# Patient Record
Sex: Male | Born: 1985 | Hispanic: Yes | Marital: Married | Smoking: Never smoker
Health system: Southern US, Community
[De-identification: ages and names within clinical notes are randomized; demographics above are authoritative.]

## PROBLEM LIST (undated history)

## (undated) DIAGNOSIS — K219 Gastro-esophageal reflux disease without esophagitis: Secondary | ICD-10-CM

## (undated) DIAGNOSIS — F988 Other specified behavioral and emotional disorders with onset usually occurring in childhood and adolescence: Secondary | ICD-10-CM

## (undated) HISTORY — PX: HERNIA REPAIR: SHX51

## (undated) HISTORY — PX: ANKLE SURGERY: SHX546

## (undated) HISTORY — PX: ARTHROSCOPIC REPAIR ACL: SUR80

---

## 2007-09-27 ENCOUNTER — Emergency Department (HOSPITAL_COMMUNITY): Admission: EM | Admit: 2007-09-27 | Discharge: 2007-09-27 | Payer: Self-pay | Admitting: Emergency Medicine

## 2013-02-28 ENCOUNTER — Other Ambulatory Visit: Payer: Self-pay | Admitting: Occupational Medicine

## 2013-02-28 ENCOUNTER — Ambulatory Visit
Admission: RE | Admit: 2013-02-28 | Discharge: 2013-02-28 | Disposition: A | Discharge status: Home / Self Care | Payer: No Typology Code available for payment source | Source: Ambulatory Visit | Attending: Occupational Medicine | Admitting: Occupational Medicine

## 2013-02-28 DIAGNOSIS — Z021 Encounter for pre-employment examination: Secondary | ICD-10-CM

## 2014-07-26 ENCOUNTER — Other Ambulatory Visit: Payer: Self-pay | Admitting: Occupational Medicine

## 2014-07-26 ENCOUNTER — Ambulatory Visit: Payer: Self-pay

## 2014-07-26 DIAGNOSIS — R52 Pain, unspecified: Secondary | ICD-10-CM

## 2016-09-24 ENCOUNTER — Emergency Department (HOSPITAL_BASED_OUTPATIENT_CLINIC_OR_DEPARTMENT_OTHER)
Admission: EM | Admit: 2016-09-24 | Discharge: 2016-09-24 | Disposition: A | Discharge status: Home / Self Care | Payer: Commercial Managed Care - HMO | Attending: Emergency Medicine | Admitting: Emergency Medicine

## 2016-09-24 ENCOUNTER — Encounter (HOSPITAL_BASED_OUTPATIENT_CLINIC_OR_DEPARTMENT_OTHER): Payer: Self-pay | Admitting: *Deleted

## 2016-09-24 ENCOUNTER — Emergency Department (HOSPITAL_BASED_OUTPATIENT_CLINIC_OR_DEPARTMENT_OTHER): Payer: Commercial Managed Care - HMO

## 2016-09-24 DIAGNOSIS — F909 Attention-deficit hyperactivity disorder, unspecified type: Secondary | ICD-10-CM | POA: Insufficient documentation

## 2016-09-24 DIAGNOSIS — R1033 Periumbilical pain: Secondary | ICD-10-CM

## 2016-09-24 DIAGNOSIS — R110 Nausea: Secondary | ICD-10-CM | POA: Insufficient documentation

## 2016-09-24 DIAGNOSIS — Z79899 Other long term (current) drug therapy: Secondary | ICD-10-CM | POA: Diagnosis not present

## 2016-09-24 HISTORY — DX: Gastro-esophageal reflux disease without esophagitis: K21.9

## 2016-09-24 HISTORY — DX: Other specified behavioral and emotional disorders with onset usually occurring in childhood and adolescence: F98.8

## 2016-09-24 LAB — CBC WITH DIFFERENTIAL/PLATELET
BASOS ABS: 0 10*3/uL (ref 0.0–0.1)
Basophils Relative: 0 %
EOS PCT: 1 %
Eosinophils Absolute: 0.1 10*3/uL (ref 0.0–0.7)
HEMATOCRIT: 43.9 % (ref 39.0–52.0)
HEMOGLOBIN: 15.3 g/dL (ref 13.0–17.0)
LYMPHS ABS: 2 10*3/uL (ref 0.7–4.0)
LYMPHS PCT: 18 %
MCH: 30.2 pg (ref 26.0–34.0)
MCHC: 34.9 g/dL (ref 30.0–36.0)
MCV: 86.6 fL (ref 78.0–100.0)
Monocytes Absolute: 0.5 10*3/uL (ref 0.1–1.0)
Monocytes Relative: 5 %
NEUTROS ABS: 8.4 10*3/uL — AB (ref 1.7–7.7)
NEUTROS PCT: 76 %
PLATELETS: 372 10*3/uL (ref 150–400)
RBC: 5.07 MIL/uL (ref 4.22–5.81)
RDW: 12.4 % (ref 11.5–15.5)
WBC: 11 10*3/uL — AB (ref 4.0–10.5)

## 2016-09-24 LAB — COMPREHENSIVE METABOLIC PANEL
ALK PHOS: 64 U/L (ref 38–126)
ALT: 53 U/L (ref 17–63)
AST: 27 U/L (ref 15–41)
Albumin: 4.9 g/dL (ref 3.5–5.0)
Anion gap: 8 (ref 5–15)
BUN: 17 mg/dL (ref 6–20)
CALCIUM: 9.6 mg/dL (ref 8.9–10.3)
CHLORIDE: 101 mmol/L (ref 101–111)
CO2: 28 mmol/L (ref 22–32)
CREATININE: 0.64 mg/dL (ref 0.61–1.24)
GFR calc Af Amer: >60 mL/min (ref >60)
Glucose, Bld: 118 mg/dL — ABNORMAL HIGH (ref 65–99)
Potassium: 4.2 mmol/L (ref 3.5–5.1)
Sodium: 137 mmol/L (ref 135–145)
Total Bilirubin: 0.9 mg/dL (ref 0.3–1.2)
Total Protein: 8.5 g/dL — ABNORMAL HIGH (ref 6.5–8.1)

## 2016-09-24 LAB — LIPASE, BLOOD: LIPASE: 19 U/L (ref 11–51)

## 2016-09-24 MED ORDER — METOCLOPRAMIDE HCL 5 MG/ML IJ SOLN
10.0000 mg | Freq: Once | INTRAMUSCULAR | Status: AC
  Administered 2016-09-24: 10 mg via INTRAVENOUS
  Filled 2016-09-24: qty 2

## 2016-09-24 MED ORDER — ONDANSETRON 4 MG PO TBDP
4.0000 mg | ORAL_TABLET | Freq: Once | ORAL | Status: AC
  Administered 2016-09-24: 4 mg via ORAL
  Filled 2016-09-24: qty 1

## 2016-09-24 MED ORDER — MORPHINE SULFATE (PF) 4 MG/ML IV SOLN
4.0000 mg | Freq: Once | INTRAVENOUS | Status: AC
  Administered 2016-09-24: 4 mg via INTRAVENOUS
  Filled 2016-09-24: qty 1

## 2016-09-24 MED ORDER — IOPAMIDOL (ISOVUE-300) INJECTION 61%
100.0000 mL | Freq: Once | INTRAVENOUS | Status: AC | PRN
  Administered 2016-09-24: 100 mL via INTRAVENOUS

## 2016-09-24 MED ORDER — ONDANSETRON HCL 4 MG PO TABS: 4.0000 mg | ORAL_TABLET | Freq: Three times a day (TID) | ORAL | 0 refills | Status: AC | PRN

## 2016-09-24 NOTE — ED Provider Notes (Signed)
[facility] Provider [facility]   CSN: [id number] Arrival date & time: 09/24/16  1447     History   Chief Complaint Chief Complaint  Patient presents with  . Abdominal Pain    HPI Mayer CamelBrian G Michalik is a 30 y.o. male.  The history is provided by the patient.  Abdominal Pain   This is a new problem. Episode onset: 1 week. Episode frequency: it was constant for 1 day; resolved with antiemetic and returned this am. The problem has not changed since onset.The pain is associated with an unknown factor. The pain is located in the periumbilical region. The pain is at a severity of 5/10. Associated symptoms include nausea. Pertinent negatives include anorexia, fever, diarrhea, hematochezia, melena, vomiting, constipation, dysuria, frequency, hematuria, headaches and arthralgias. The symptoms are aggravated by palpation and certain positions (standing up straight). The symptoms are relieved by certain positions (bending at the hips).    Past Medical History:  Diagnosis Date  . ADD (attention deficit disorder)   . GERD (gastroesophageal reflux disease)     There are no active problems to display for this patient.   Past Surgical History:  Procedure Laterality Date  . ARTHROSCOPIC REPAIR ACL    . HERNIA REPAIR         Home Medications    Prior to Admission medications   Medication Sig Start Date End Date Taking? Authorizing Provider  methylphenidate 36 MG PO CR tablet Take 36 mg by mouth daily.   Yes Historical Provider, MD  pantoprazole (PROTONIX) 40 MG tablet Take 40 mg by mouth daily.   Yes Historical Provider, MD  ondansetron (ZOFRAN) 4 MG tablet Take 1 tablet (4 mg total) by mouth every 8 (eight) hours as needed for nausea or vomiting. 09/24/16 09/27/16  Nira ConnPedro Eduardo Cardama, MD    Family History History reviewed. No pertinent family history.  Social History Social History  Substance Use Topics  . Smoking status: Never Smoker  . Smokeless tobacco: Never Used  .  Alcohol use No     Allergies   Patient has no known allergies.   Review of Systems Review of Systems  Constitutional: Negative for fever.  Gastrointestinal: Positive for abdominal pain and nausea. Negative for anorexia, constipation, diarrhea, hematochezia, melena and vomiting.  Genitourinary: Negative for dysuria, frequency and hematuria.  Musculoskeletal: Negative for arthralgias.  Neurological: Negative for headaches.  Ten systems are reviewed and are negative for acute change except as noted in the HPI    Physical Exam Updated Vital Signs BP 140/90   Pulse 96   Temp 97.6 F (36.4 C) (Oral)   Resp 18   Ht 5\' 8"  (1.727 m)   Wt 230 lb (104.3 kg)   SpO2 98%   BMI 34.97 kg/m   Physical Exam  Constitutional: He is oriented to person, place, and time. He appears well-developed and well-nourished. No distress.  HENT:  Head: Normocephalic and atraumatic.  Nose: Nose normal.  Eyes: Conjunctivae and EOM are normal. Pupils are equal, round, and reactive to light. Right eye exhibits no discharge. Left eye exhibits no discharge. No scleral icterus.  Neck: Normal range of motion. Neck supple.  Cardiovascular: Normal rate and regular rhythm.  Exam reveals no gallop and no friction rub.   No murmur heard. Pulmonary/Chest: Effort normal and breath sounds normal. No stridor. No respiratory distress. He has no rales.  Abdominal: Soft. He exhibits no distension. There is tenderness in the periumbilical area. There is no rigidity, no rebound, no guarding  and no CVA tenderness. No hernia. Hernia confirmed negative in the ventral area, confirmed negative in the right inguinal area and confirmed negative in the left inguinal area.    Musculoskeletal: He exhibits no edema or tenderness.  Neurological: He is alert and oriented to person, place, and time.  Skin: Skin is warm and dry. No rash noted. He is not diaphoretic. No erythema.  Psychiatric: He has a normal mood and affect.  Vitals  reviewed.    ED Treatments / Results  Labs (all labs ordered are listed, but only abnormal results are displayed) Labs Reviewed  CBC WITH DIFFERENTIAL/PLATELET - Abnormal; Notable for the following:       Result Value   WBC 11.0 (*)    Neutro Abs 8.4 (*)    All other components within normal limits  COMPREHENSIVE METABOLIC PANEL - Abnormal; Notable for the following:    Glucose, Bld 118 (*)    Total Protein 8.5 (*)    All other components within normal limits  LIPASE, BLOOD    EKG  EKG Interpretation None       Radiology Ct Abdomen Pelvis W Contrast  Result Date: 09/24/2016 CLINICAL DATA:  Patient with right lower quadrant pain for 1 week. Prior right inguinal hernia repair. EXAM: CT ABDOMEN AND PELVIS WITH CONTRAST TECHNIQUE: Multidetector CT imaging of the abdomen and pelvis was performed using the standard protocol following bolus administration of intravenous contrast. CONTRAST:  100mL ISOVUE-300 IOPAMIDOL (ISOVUE-300) INJECTION 61% COMPARISON:  None. FINDINGS: Lower chest: Normal heart size. Bilateral gynecomastia. Dependent atelectasis within the bilateral lower lobes. No pleural effusion. Hepatobiliary: Liver is normal in size and contour. No focal lesion is identified. Gallbladder is unremarkable. Pancreas: Unremarkable Spleen: Unremarkable Adrenals/Urinary Tract: The adrenal glands are normal. Kidneys enhance symmetrically with contrast. No hydronephrosis. No nephroureterolithiasis. Urinary bladder is unremarkable. Stomach/Bowel: No abnormal bowel wall thickening or evidence for bowel obstruction. No free fluid or free intraperitoneal air. The appendix is normal. Normal morphology of the stomach. No free fluid or free intraperitoneal air. Vascular/Lymphatic: Normal caliber abdominal aorta. No retroperitoneal lymphadenopathy. Reproductive: Prostate unremarkable. Other: None. Musculoskeletal: No aggressive or acute appearing osseous lesions. IMPRESSION: No acute process within  abdomen or pelvis. Electronically Signed   By: Annia Beltrew  Davis M.D.   On: 09/24/2016 17:36    Procedures Procedures (including critical care time)  Medications Ordered in ED Medications  ondansetron (ZOFRAN-ODT) disintegrating tablet 4 mg (4 mg Oral Given 09/24/16 1531)  metoCLOPramide (REGLAN) injection 10 mg (10 mg Intravenous Given 09/24/16 1626)  morphine 4 MG/ML injection 4 mg (4 mg Intravenous Given 09/24/16 1626)  iopamidol (ISOVUE-300) 61 % injection 100 mL (100 mLs Intravenous Contrast Given 09/24/16 1713)     Initial Impression / Assessment and Plan / ED Course  I have reviewed the triage vital signs and the nursing notes.  Pertinent labs & imaging results that were available during my care of the patient were reviewed by me and considered in my medical decision making (see chart for details).  Clinical Course     Labs reveal leukocytosis. Therefore given the patient's periumbilical and right lower quadrant pain we'll obtain a CT to rule out appendicitis.  CT negative for any intra-abdominal inflammatory infectious process. Patient provided with IV fluids and symptomatic medicine. Patient able to tolerate by mouth. Safe for discharge with strict return precautions.  Final Clinical Impressions(s) / ED Diagnoses   Final diagnoses:  Periumbilical abdominal pain  Nausea   Disposition: Discharge  Condition: Good  I have  discussed the results, Dx and Tx plan with the patient who expressed understanding and agree(s) with the plan. Discharge instructions discussed at great length. The patient was given strict return precautions who verbalized understanding of the instructions. No further questions at time of discharge.    New Prescriptions   ONDANSETRON (ZOFRAN) 4 MG TABLET    Take 1 tablet (4 mg total) by mouth every 8 (eight) hours as needed for nausea or vomiting.    Follow Up: Jolene Provost, MD [address] [place] [address] [phone]  Schedule an  appointment as soon as possible for a visit  in 3-5 days, If symptoms do not improve or  worsen      Nira Conn, MD 09/24/16 1819

## 2016-09-24 NOTE — ED Triage Notes (Signed)
Pt c/o right lower abd pain x 1 week.

## 2016-09-24 NOTE — ED Notes (Signed)
Pt has increased nausea after PO challenge with H2O. EDP notified.

## 2017-05-14 ENCOUNTER — Ambulatory Visit
Admission: RE | Admit: 2017-05-14 | Discharge: 2017-05-14 | Disposition: A | Discharge status: Home / Self Care | Payer: Worker's Compensation | Source: Ambulatory Visit | Attending: Nurse Practitioner | Admitting: Nurse Practitioner

## 2017-05-14 ENCOUNTER — Other Ambulatory Visit: Payer: Self-pay | Admitting: Nurse Practitioner

## 2017-05-14 DIAGNOSIS — S6992XS Unspecified injury of left wrist, hand and finger(s), sequela: Secondary | ICD-10-CM
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
## 2018-10-07 ENCOUNTER — Ambulatory Visit
Admission: RE | Admit: 2018-10-07 | Discharge: 2018-10-07 | Disposition: A | Discharge status: Home / Self Care | Payer: No Typology Code available for payment source | Source: Ambulatory Visit | Attending: Nurse Practitioner | Admitting: Nurse Practitioner

## 2018-10-07 ENCOUNTER — Other Ambulatory Visit: Payer: Self-pay | Admitting: Nurse Practitioner

## 2018-10-07 DIAGNOSIS — Z0289 Encounter for other administrative examinations: Secondary | ICD-10-CM

## 2019-05-20 ENCOUNTER — Emergency Department (HOSPITAL_BASED_OUTPATIENT_CLINIC_OR_DEPARTMENT_OTHER): Payer: 59

## 2019-05-20 ENCOUNTER — Other Ambulatory Visit: Payer: Self-pay

## 2019-05-20 ENCOUNTER — Emergency Department (HOSPITAL_BASED_OUTPATIENT_CLINIC_OR_DEPARTMENT_OTHER)
Admission: EM | Admit: 2019-05-20 | Discharge: 2019-05-20 | Disposition: A | Discharge status: Home / Self Care | Payer: 59 | Attending: Emergency Medicine | Admitting: Emergency Medicine

## 2019-05-20 ENCOUNTER — Encounter (HOSPITAL_BASED_OUTPATIENT_CLINIC_OR_DEPARTMENT_OTHER): Payer: Self-pay | Admitting: Emergency Medicine

## 2019-05-20 DIAGNOSIS — Z79899 Other long term (current) drug therapy: Secondary | ICD-10-CM | POA: Insufficient documentation

## 2019-05-20 DIAGNOSIS — K219 Gastro-esophageal reflux disease without esophagitis: Secondary | ICD-10-CM | POA: Insufficient documentation

## 2019-05-20 DIAGNOSIS — R079 Chest pain, unspecified: Secondary | ICD-10-CM | POA: Diagnosis present

## 2019-05-20 LAB — CBC
HCT: 41.7 % (ref 39.0–52.0)
Hemoglobin: 14 g/dL (ref 13.0–17.0)
MCH: 29.6 pg (ref 26.0–34.0)
MCHC: 33.6 g/dL (ref 30.0–36.0)
MCV: 88.2 fL (ref 80.0–100.0)
Platelets: 322 10*3/uL (ref 150–400)
RBC: 4.73 MIL/uL (ref 4.22–5.81)
RDW: 11.9 % (ref 11.5–15.5)
WBC: 6.3 10*3/uL (ref 4.0–10.5)
nRBC: 0 % (ref 0.0–0.2)

## 2019-05-20 LAB — BASIC METABOLIC PANEL
Anion gap: 12 (ref 5–15)
BUN: 11 mg/dL (ref 6–20)
CO2: 23 mmol/L (ref 22–32)
Calcium: 9.1 mg/dL (ref 8.9–10.3)
Chloride: 102 mmol/L (ref 98–111)
Creatinine, Ser: 0.79 mg/dL (ref 0.61–1.24)
GFR calc Af Amer: >60 mL/min (ref >60)
GFR calc non Af Amer: >60 mL/min (ref >60)
Glucose, Bld: 128 mg/dL — ABNORMAL HIGH (ref 70–99)
Potassium: 3.9 mmol/L (ref 3.5–5.1)
Sodium: 137 mmol/L (ref 135–145)

## 2019-05-20 LAB — TROPONIN I (HIGH SENSITIVITY): Troponin I (High Sensitivity): 2 ng/L (ref <18)

## 2019-05-20 MED ORDER — IOHEXOL 350 MG/ML SOLN
100.0000 mL | Freq: Once | INTRAVENOUS | Status: AC | PRN
  Administered 2019-05-20: 100 mL via INTRAVENOUS

## 2019-05-20 MED ORDER — LIDOCAINE VISCOUS HCL 2 % MT SOLN
15.0000 mL | Freq: Once | OROMUCOSAL | Status: AC
  Administered 2019-05-20: 15 mL via ORAL
  Filled 2019-05-20: qty 15

## 2019-05-20 MED ORDER — ALUM & MAG HYDROXIDE-SIMETH 200-200-20 MG/5ML PO SUSP
30.0000 mL | Freq: Once | ORAL | Status: AC
  Administered 2019-05-20: 30 mL via ORAL
  Filled 2019-05-20: qty 30

## 2019-05-20 MED ORDER — SUCRALFATE 1 G PO TABS: 1.0000 g | ORAL_TABLET | Freq: Three times a day (TID) | ORAL | 0 refills | Status: DC

## 2019-05-20 NOTE — Discharge Instructions (Signed)
Take the Carafate in addition to your Protonix.  Follow-up with your doctor next week if the symptoms have not resolved.  Return as needed for worsening symptoms

## 2019-05-20 NOTE — ED Provider Notes (Signed)
[facility] EMERGENCY DEPARTMENT Provider Note   CSN: [id number] Arrival date & time: 05/20/19  [id number]    History   Chief Complaint Chief Complaint  Patient presents with   Chest Pain    HPI Noah Santos is a 33 y.o. male.  HPI: A 33 year old patient presents for evaluation of chest pain. Initial onset of pain was more than 6 hours ago. The patient's chest pain is described as heaviness/pressure/tightness and is not worse with exertion. The patient complains of nausea. The patient's chest pain is middle- or left-sided, is not well-localized, is not sharp and does not radiate to the arms/jaw/neck. The patient denies diaphoresis. The patient has no history of stroke, has no history of peripheral artery disease, has not smoked in the past 90 days, denies any history of treated diabetes, has no relevant family history of coronary artery disease (first degree relative at less than age 34), is not hypertensive, has no history of hypercholesterolemia and does not have an elevated BMI (>=30).  Patient states he started having symptoms last evening while he was eating dinner.  He felt like there was a ball in the center of his chest.  Symptoms persisted throughout the night.  This morning when he woke up he noticed it was back into his shoulder blades and this concerned him.  He does not feel short of breath.  He denies any vomiting or diarrhea.  He is not having any trouble with his saliva.  Patient denies any history of heart disease.  He does have history of acid reflux but has never had these symptoms with it in the past. HPI  Past Medical History:  Diagnosis Date   ADD (attention deficit disorder)    GERD (gastroesophageal reflux disease)     There are no active problems to display for this patient.   Past Surgical History:  Procedure Laterality Date   ANKLE SURGERY     ARTHROSCOPIC REPAIR ACL     HERNIA REPAIR          Home Medications    Prior to Admission  medications   Medication Sig Start Date End Date Taking? Authorizing Provider  cetirizine (ZYRTEC) 10 MG tablet Take 10 mg by mouth daily.   Yes [provider]  methylphenidate 36 MG PO CR tablet Take 36 mg by mouth daily.   Yes [provider]  pantoprazole (PROTONIX) 40 MG tablet Take 40 mg by mouth daily.   Yes [provider]  sucralfate (CARAFATE) 1 g tablet Take 1 tablet (1 g total) by mouth 4 (four) times daily -  with meals and at bedtime. 05/20/19   Linwood DibblesKnapp, Mikeya Tomasetti, MD    Family History Family History  Problem Relation Age of Onset   Hyperlipidemia Mother    Diabetes Father    Hypertension Father    Hyperlipidemia Father     Social History Social History   Tobacco Use   Smoking status: Never Smoker   Smokeless tobacco: Never Used  Substance Use Topics   Alcohol use: Yes    Comment: socially   Drug use: No     Allergies   Patient has no known allergies.   Review of Systems Review of Systems  All other systems reviewed and are negative.    Physical Exam Updated Vital Signs BP 124/78 (BP Location: Left Arm)    Pulse 62    Temp 98.4 F (36.9 C) (Oral)    Resp 12    Ht 1.753 m (5'  9")    Wt 117.9 kg    SpO2 98%    BMI 38.40 kg/m   Physical Exam Vitals signs and nursing note reviewed.  Constitutional:      General: He is not in acute distress.    Appearance: He is well-developed.  HENT:     Head: Normocephalic and atraumatic.     Right Ear: External ear normal.     Left Ear: External ear normal.  Eyes:     General: No scleral icterus.       Right eye: No discharge.        Left eye: No discharge.     Conjunctiva/sclera: Conjunctivae normal.  Neck:     Musculoskeletal: Neck supple.     Trachea: No tracheal deviation.  Cardiovascular:     Rate and Rhythm: Normal rate and regular rhythm.  Pulmonary:     Effort: Pulmonary effort is normal. No respiratory distress.     Breath sounds: Normal breath sounds. No stridor. No  wheezing or rales.  Abdominal:     General: Bowel sounds are normal. There is no distension.     Palpations: Abdomen is soft.     Tenderness: There is no abdominal tenderness. There is no guarding or rebound.  Musculoskeletal:        General: No tenderness.  Skin:    General: Skin is warm and dry.     Findings: No rash.  Neurological:     Mental Status: He is alert.     Cranial Nerves: No cranial nerve deficit (no facial droop, extraocular movements intact, no slurred speech).     Sensory: No sensory deficit.     Motor: No abnormal muscle tone or seizure activity.     Coordination: Coordination normal.      ED Treatments / Results  Labs (all labs ordered are listed, but only abnormal results are displayed) Labs Reviewed  BASIC METABOLIC PANEL - Abnormal; Notable for the following components:      Result Value   Glucose, Bld 128 (*)    All other components within normal limits  CBC  TROPONIN I (HIGH SENSITIVITY)    EKG EKG Interpretation  Date/Time:  Saturday May 20 2019 06:58:33 EDT Ventricular Rate:  72 PR Interval:    QRS Duration: 88 QT Interval:  369 QTC Calculation: 404 R Axis:   66 Text Interpretation:  Sinus rhythm Borderline T abnormalities, inferior leads No old tracing to compare Confirmed by Dorie Rank [phone]) on 05/20/2019 7:01:08 AM   Radiology Dg Chest Portable 1 View  Result Date: 05/20/2019 CLINICAL DATA:  33 year old male with history of chest pain radiating to the back since yesterday evening. EXAM: PORTABLE CHEST 1 VIEW COMPARISON:  Chest x-ray 10/07/2018. FINDINGS: Lung volumes are normal. No consolidative airspace disease. No pleural effusions. No pneumothorax. No pulmonary nodule or mass noted. Pulmonary vasculature and the cardiomediastinal silhouette are within normal limits. IMPRESSION: No radiographic evidence of acute cardiopulmonary disease. Electronically Signed   By: Vinnie Langton M.D.   On: 05/20/2019 07:42   Ct Angio Chest Aorta W  And/or Wo Contrast  Result Date: 05/20/2019 CLINICAL DATA:  33 year old male with chest pain since eating yesterday evening. Chest pain radiates into the back. No associated shortness of breath. EXAM: CT ANGIOGRAPHY CHEST WITH CONTRAST TECHNIQUE: Multidetector CT imaging of the chest was performed using the standard protocol during bolus administration of intravenous contrast. Multiplanar CT image reconstructions and MIPs were obtained to evaluate the vascular anatomy. CONTRAST:  177mL OMNIPAQUE  IOHEXOL 350 MG/ML SOLN COMPARISON:  None. FINDINGS: Cardiovascular: Precontrast images demonstrate no crescentic high attenuation associated with the wall of the thoracic aorta to suggest acute intramural hematoma. No aneurysm or dissection of the thoracic aorta. No significant atherosclerotic disease in the thoracic aorta. No coronary artery calcifications. Heart size is normal. There is no significant pericardial fluid, thickening or pericardial calcification. Mediastinum/Nodes: No pathologically enlarged mediastinal or hilar lymph nodes. Esophagus is unremarkable in appearance. No axillary lymphadenopathy. Lungs/Pleura: No acute consolidative airspace disease. No pleural effusions. Minimal subsegmental atelectasis in the lower lobes of the lungs bilaterally. No suspicious appearing pulmonary nodules or masses are noted. Upper Abdomen: Severe diffuse low attenuation throughout the visualized hepatic parenchyma, indicative of severe hepatic steatosis. Focal fatty sparing adjacent to the gallbladder fossa in segment 4B of the liver incidentally noted. Musculoskeletal: There are no aggressive appearing lytic or blastic lesions noted in the visualized portions of the skeleton. Review of the MIP images confirms the above findings. IMPRESSION: 1. No acute findings in the thorax to account for the patient's symptoms. Specifically, no evidence of acute aortic syndrome. 2. Severe hepatic steatosis. Electronically Signed   By:  Trudie Reedaniel  Entrikin M.D.   On: 05/20/2019 09:09    Procedures Procedures (including critical care time)  Medications Ordered in ED Medications  alum & mag hydroxide-simeth (MAALOX/MYLANTA) 200-200-20 MG/5ML suspension 30 mL (30 mLs Oral Given 05/20/19 0722)    And  lidocaine (XYLOCAINE) 2 % viscous mouth solution 15 mL (15 mLs Oral Given 05/20/19 0722)  iohexol (OMNIPAQUE) 350 MG/ML injection 100 mL (100 mLs Intravenous Contrast Given 05/20/19 0841)     Initial Impression / Assessment and Plan / ED Course  I have reviewed the triage vital signs and the nursing notes.  Pertinent labs & imaging results that were available during my care of the patient were reviewed by me and considered in my medical decision making (see chart for details).  Clinical Course as of May 20 1007  Sat May 20, 2019  [id number] Patient is still having pain in his chest.  Concerned about the radiation to his back.  Initial evaluation unremarkable.  Will CT angiogram to evaluate for aortic abnormality   [JK]    Clinical Course User Index [JK] Linwood DibblesKnapp, Maclean Foister, MD    HEAR Score: 2  Patient presented to the emergency with complaints of chest pain.  Overall low risk hear score.  Initial troponin is normal.  I doubt acute coronary syndrome.  Patient had persistent pain radiating to her back so a CT angiogram was performed.  No signs of any aortic abnormality to account for his pain.   I suspect the patient's symptoms are related to esophageal reflux at this time.  He does have a history of this and previously had an endoscopy.  I will have him try adding Carafate to his regimen. Final Clinical Impressions(s) / ED Diagnoses   Final diagnoses:  Gastroesophageal reflux disease, esophagitis presence not specified    ED Discharge Orders         Ordered    sucralfate (CARAFATE) 1 g tablet  3 times daily with meals & bedtime     05/20/19 1007           Linwood DibblesKnapp, Dorotha Hirschi, MD 05/20/19 1009

## 2019-05-20 NOTE — ED Notes (Signed)
ED Provider at bedside. 

## 2019-05-20 NOTE — ED Triage Notes (Signed)
Pt states he was eating supper last night and while he was eating he got a tightness in his chest  Pt states the pain has been constant since and now it radiates into his back between his shoulder blades  Pt states he feels like he has a bubble in his chest   Pt has hx of GERD and states he takes his medication on a regular basis

## 2019-11-05 IMAGING — CR DG CHEST 1V
1 series · 1 of 1 positions shown · non-contrast
Comparison: 02/28/2013.

CLINICAL DATA: Routine employment check.

EXAM:
CHEST  1 VIEW

[w chest pa]
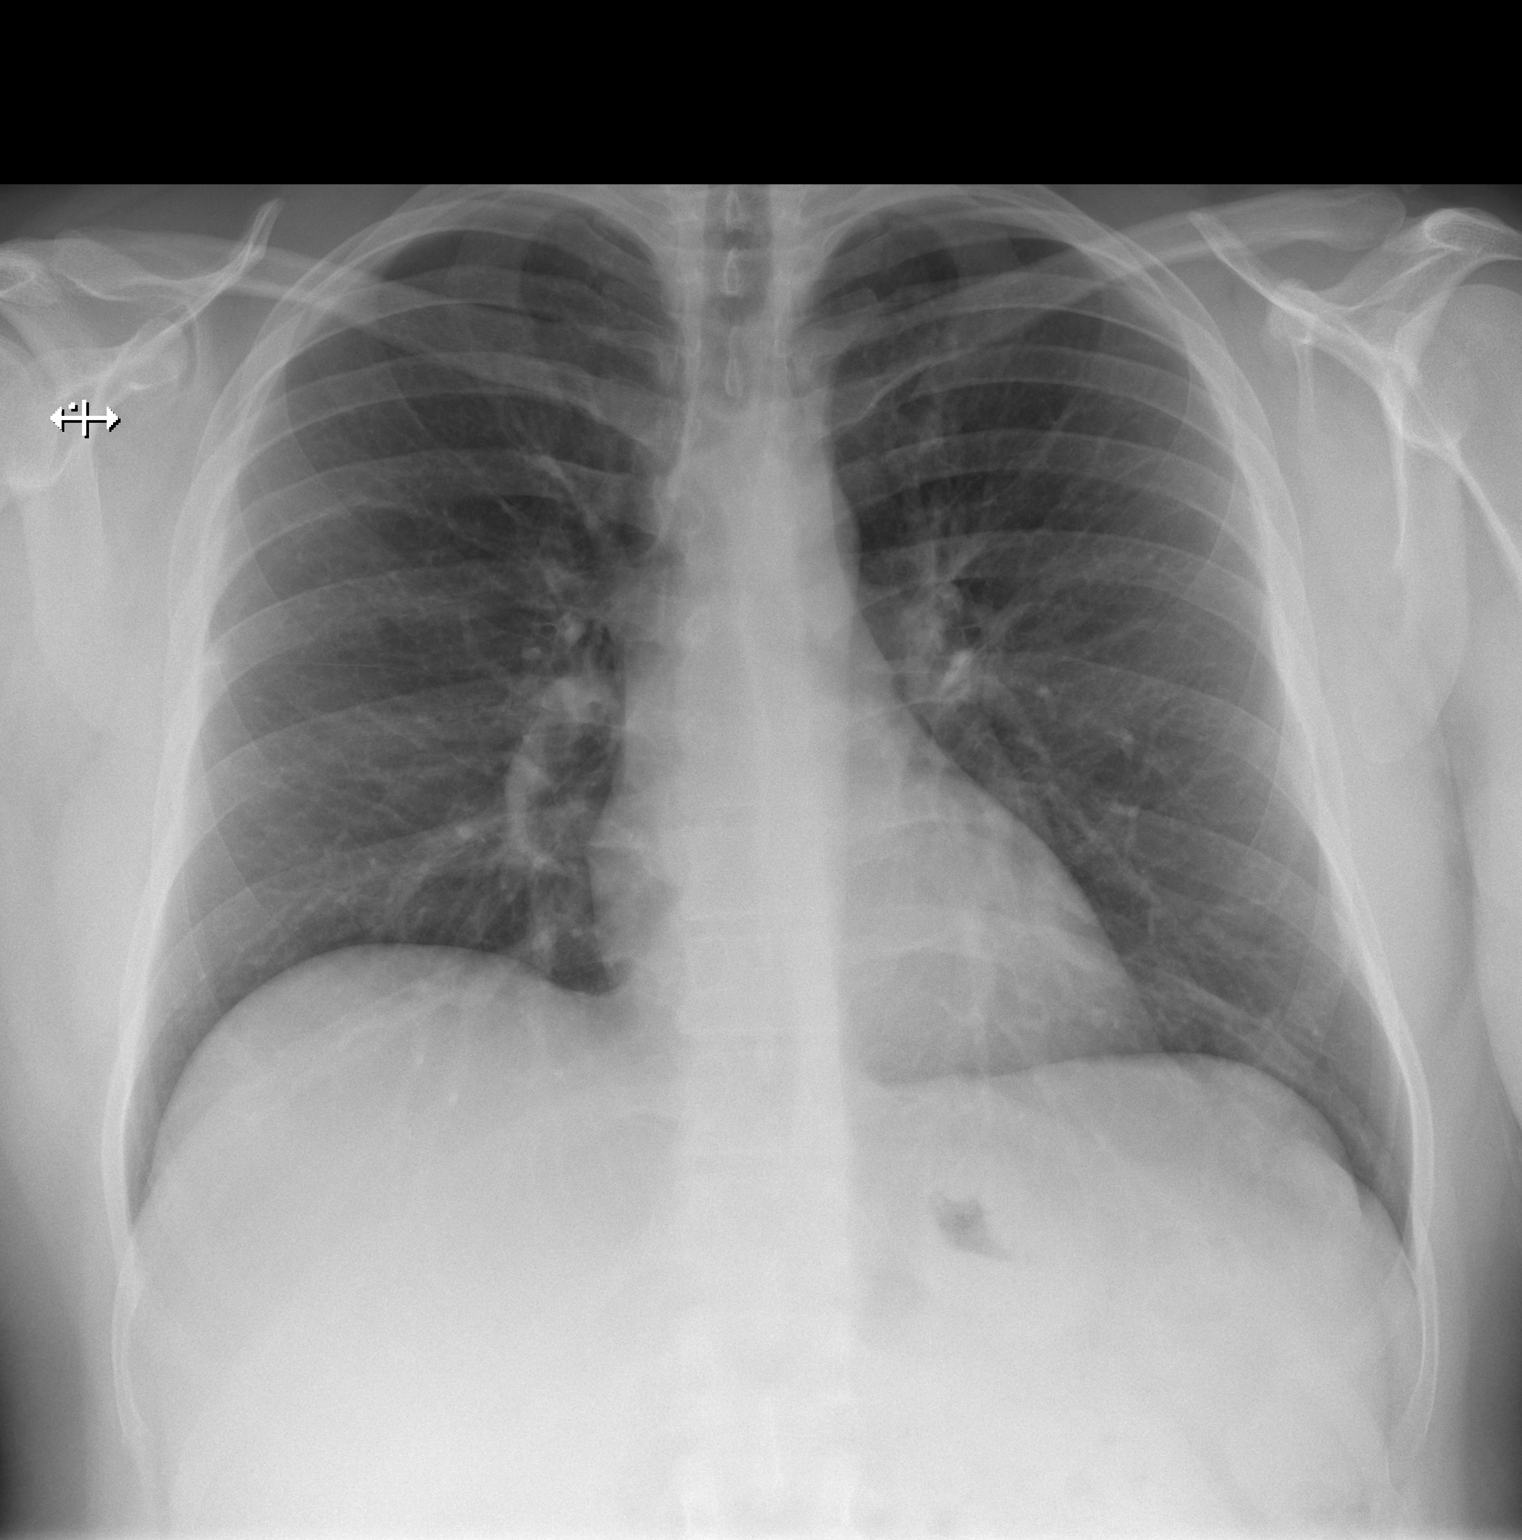

[1 of 1 positions shown; findings below may reference images not displayed]

FINDINGS: Mediastinum and hilar structures normal. Lungs are clear. No pleural
effusion or pneumothorax. Heart size normal. No acute bony
abnormality.
IMPRESSION: No acute abnormality.

## 2020-06-17 IMAGING — DX PORTABLE CHEST - 1 VIEW
1 series · 1 of 1 positions shown · non-contrast
Comparison: Chest x-ray 10/07/2018.

CLINICAL DATA: 33-year-old male with history of chest pain
radiating to the back since yesterday evening.

EXAM:
PORTABLE CHEST 1 VIEW

[chest ap]
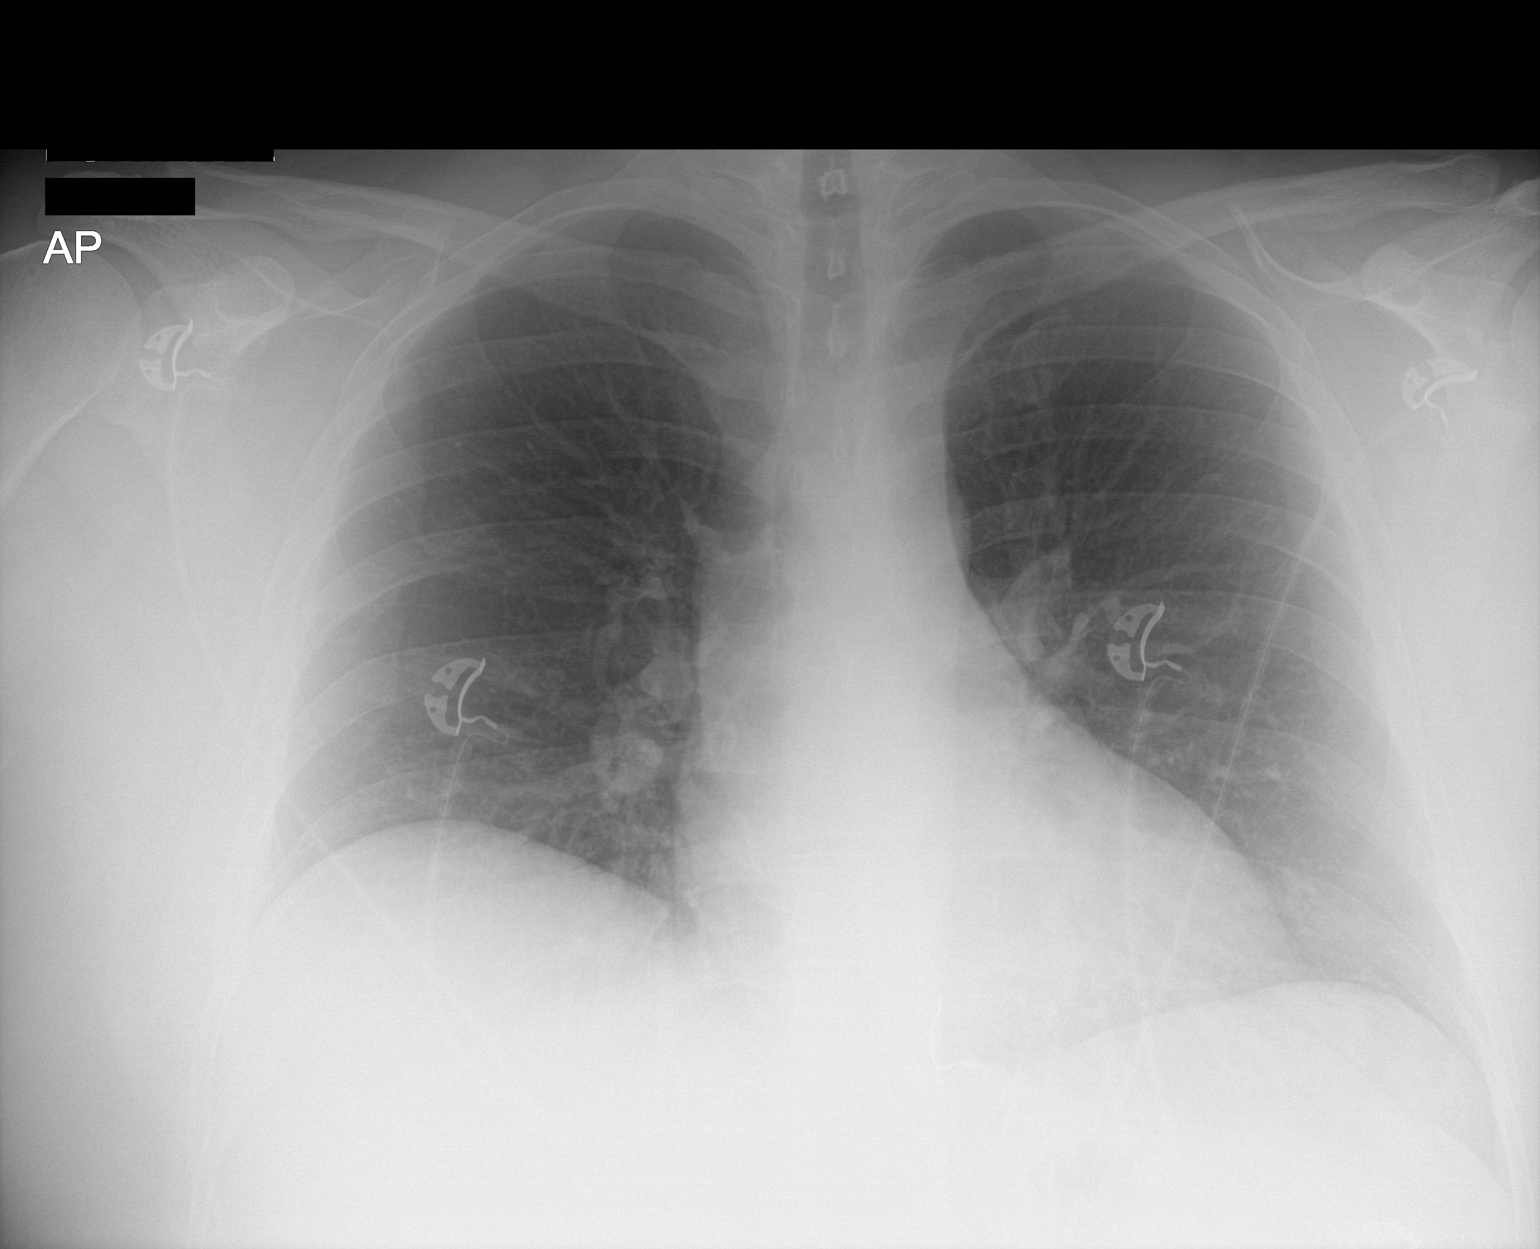

[1 of 1 positions shown; findings below may reference images not displayed]

FINDINGS: Lung volumes are normal. No consolidative airspace disease. No
pleural effusions. No pneumothorax. No pulmonary nodule or mass
noted. Pulmonary vasculature and the cardiomediastinal silhouette
are within normal limits.
IMPRESSION: No radiographic evidence of acute cardiopulmonary disease.

## 2022-11-19 ENCOUNTER — Encounter: Payer: Self-pay | Admitting: *Deleted

## 2022-11-19 ENCOUNTER — Ambulatory Visit
Admission: EM | Admit: 2022-11-19 | Discharge: 2022-11-19 | Disposition: A | Discharge status: Home / Self Care | Payer: 59 | Attending: Family Medicine | Admitting: Family Medicine

## 2022-11-19 DIAGNOSIS — K760 Fatty (change of) liver, not elsewhere classified: Secondary | ICD-10-CM | POA: Insufficient documentation

## 2022-11-19 DIAGNOSIS — E785 Hyperlipidemia, unspecified: Secondary | ICD-10-CM | POA: Insufficient documentation

## 2022-11-19 DIAGNOSIS — Z9109 Other allergy status, other than to drugs and biological substances: Secondary | ICD-10-CM | POA: Insufficient documentation

## 2022-11-19 DIAGNOSIS — K219 Gastro-esophageal reflux disease without esophagitis: Secondary | ICD-10-CM | POA: Insufficient documentation

## 2022-11-19 DIAGNOSIS — E119 Type 2 diabetes mellitus without complications: Secondary | ICD-10-CM | POA: Insufficient documentation

## 2022-11-19 DIAGNOSIS — J101 Influenza due to other identified influenza virus with other respiratory manifestations: Secondary | ICD-10-CM

## 2022-11-19 DIAGNOSIS — E669 Obesity, unspecified: Secondary | ICD-10-CM | POA: Insufficient documentation

## 2022-11-19 LAB — POC INFLUENZA A AND B ANTIGEN (URGENT CARE ONLY)
Influenza A Ag: POSITIVE — AB
Influenza B Ag: NEGATIVE

## 2022-11-19 LAB — POC SARS CORONAVIRUS 2 AG -  ED: SARS Coronavirus 2 Ag: NEGATIVE

## 2022-11-19 MED ORDER — OSELTAMIVIR PHOSPHATE 75 MG PO CAPS: 75.0000 mg | ORAL_CAPSULE | Freq: Two times a day (BID) | ORAL | 0 refills | Status: AC

## 2022-11-19 NOTE — ED Triage Notes (Signed)
Patient c/o cough, nasal congestion x 3 days. Reports low grade fever. Taking itc alka seltzer cold.

## 2022-11-19 NOTE — Discharge Instructions (Signed)
Take Tamiflu 2 times a day for 5 days Take 2 doses today Drink lots of water May take over-the-counter cough and cold medicines as needed May take Tylenol or ibuprofen for pain and fever  People close to you may take Tamiflu as prevention.  May call for a video visit or call primary care doctor for advice

## 2022-11-19 NOTE — ED Provider Notes (Signed)
[facility]    CSN: [id number] Arrival date & time: 11/19/22  1237      History   Chief Complaint Chief Complaint  Patient presents with   Cough   Nasal Congestion    HPI Noah Santos is a 37 y.o. male.   HPI  Patient is here for an upper respiratory infection.  Has had cough nasal congestion fever to 100.6 at home.  Some achiness.  Feels tired.  He has been taking Alka-Seltzer cold with this illness, some improvement.  No known exposure to illness.  Patient has newly diagnosed diabetes and has been placed on Mounjaro Is also on Protonix for GERD and allergy medicine  Past Medical History:  Diagnosis Date   ADD (attention deficit disorder)    GERD (gastroesophageal reflux disease)     Patient Active Problem List   Diagnosis Date Noted   Type 2 diabetes mellitus ([place]) 11/19/2022   Obesity 11/19/2022   GERD (gastroesophageal reflux disease) 11/19/2022   Environmental allergies 11/19/2022   Fatty liver 11/19/2022   Hyperlipidemia LDL goal <100 11/19/2022    Past Surgical History:  Procedure Laterality Date   ANKLE SURGERY     ARTHROSCOPIC REPAIR ACL     HERNIA REPAIR         Home Medications    Prior to Admission medications   Medication Sig Start Date End Date Taking? Authorizing Provider  montelukast (SINGULAIR) 10 MG tablet Take 10 mg by mouth at bedtime.   Yes [provider]  oseltamivir (TAMIFLU) 75 MG capsule Take 1 capsule (75 mg total) by mouth every 12 (twelve) hours. 11/19/22  Yes Raylene Everts, MD  cetirizine (ZYRTEC) 10 MG tablet Take 10 mg by mouth daily.    [provider]  pantoprazole (PROTONIX) 40 MG tablet Take 40 mg by mouth daily.    [provider]    Family History Family History  Problem Relation Age of Onset   Hyperlipidemia Mother    Diabetes Father    Hypertension Father    Hyperlipidemia Father     Social History Social History   Tobacco Use   Smoking status: Never    Smokeless tobacco: Never  Vaping Use   Vaping Use: Never used  Substance Use Topics   Alcohol use: Yes    Comment: socially   Drug use: No     Allergies   Patient has no known allergies.   Review of Systems Review of Systems  See HPI Physical Exam Triage Vital Signs ED Triage Vitals  Enc Vitals Group     BP 11/19/22 1250 (!) 140/83     Pulse Rate 11/19/22 1250 99     Resp 11/19/22 1250 18     Temp 11/19/22 1250 99.3 F (37.4 C)     Temp Source 11/19/22 1250 Oral     SpO2 11/19/22 1250 96 %     Weight 11/19/22 1248 270 lb (122.5 kg)     Height 11/19/22 1248 5\' 9"  (1.753 m)     Head Circumference --      Peak Flow --      Pain Score 11/19/22 1248 0     Pain Loc --      Pain Edu? --      Excl. in [place]? --    No data found.  Updated Vital Signs BP (!) 140/83 (BP Location: Left Arm)   Pulse 99   Temp 99.3 F (37.4 C) (Oral)   Resp 18  Ht 5\' 9"  (1.753 m)   Wt 122.5 kg   SpO2 96%   BMI 39.87 kg/m       Physical Exam Constitutional:      General: He is not in acute distress.    Appearance: He is well-developed. He is obese. He is ill-appearing.  HENT:     Head: Normocephalic and atraumatic.  Eyes:     Conjunctiva/sclera: Conjunctivae normal.     Pupils: Pupils are equal, round, and reactive to light.  Cardiovascular:     Rate and Rhythm: Normal rate and regular rhythm.     Heart sounds: Normal heart sounds.  Pulmonary:     Effort: Pulmonary effort is normal. No respiratory distress.     Breath sounds: Normal breath sounds.  Abdominal:     General: There is no distension.     Palpations: Abdomen is soft.  Musculoskeletal:        General: Normal range of motion.     Cervical back: Normal range of motion.  Skin:    General: Skin is warm and dry.  Neurological:     Mental Status: He is alert.      UC Treatments / Results  Labs (all labs ordered are listed, but only abnormal results are displayed) Labs Reviewed  POC INFLUENZA A AND B ANTIGEN  (URGENT CARE ONLY) - Abnormal; Notable for the following components:      Result Value   Influenza A Ag Positive (*)    All other components within normal limits  POC SARS CORONAVIRUS 2 AG -  ED    EKG   Radiology No results found.  Procedures Procedures (including critical care time)  Medications Ordered in UC Medications - No data to display  Initial Impression / Assessment and Plan / UC Course  I have reviewed the triage vital signs and the nursing notes.  Pertinent labs & imaging results that were available during my care of the patient were reviewed by me and considered in my medical decision making (see chart for details).     Discussed home care for flu Discussed prevention of flu  Final Clinical Impressions(s) / UC Diagnoses   Final diagnoses:  Influenza A     Discharge Instructions      Take Tamiflu 2 times a day for 5 days Take 2 doses today Drink lots of water May take over-the-counter cough and cold medicines as needed May take Tylenol or ibuprofen for pain and fever  People close to you may take Tamiflu as prevention.  May call for a video visit or call primary care doctor for advice     ED Prescriptions     Medication Sig Dispense Auth. Provider   oseltamivir (TAMIFLU) 75 MG capsule Take 1 capsule (75 mg total) by mouth every 12 (twelve) hours. 10 capsule Raylene Everts, MD      PDMP not reviewed this encounter.   Raylene Everts, MD 11/19/22 1328

## 2023-07-28 ENCOUNTER — Encounter: Payer: Self-pay | Admitting: Nurse Practitioner

## 2023-07-28 ENCOUNTER — Other Ambulatory Visit: Payer: Self-pay | Admitting: Nurse Practitioner

## 2023-07-28 ENCOUNTER — Ambulatory Visit
Admission: RE | Admit: 2023-07-28 | Discharge: 2023-07-28 | Disposition: A | Discharge status: Home / Self Care | Payer: No Typology Code available for payment source | Source: Ambulatory Visit | Attending: Nurse Practitioner | Admitting: Nurse Practitioner

## 2023-07-28 DIAGNOSIS — Z139 Encounter for screening, unspecified: Secondary | ICD-10-CM
